# Patient Record
Sex: Male | Born: 1980 | Race: White | Hispanic: No | Marital: Single | Smoking: Current every day smoker
Health system: Southern US, Community
[De-identification: ages and names within clinical notes are randomized; demographics above are authoritative.]

## PROBLEM LIST (undated history)

## (undated) HISTORY — PX: EYE SURGERY: SHX253

---

## 2007-03-06 ENCOUNTER — Emergency Department: Payer: Self-pay | Admitting: Emergency Medicine

## 2008-06-25 ENCOUNTER — Ambulatory Visit: Payer: Self-pay | Admitting: Emergency Medicine

## 2010-05-22 ENCOUNTER — Emergency Department: Payer: Self-pay | Admitting: Unknown Physician Specialty

## 2011-08-28 ENCOUNTER — Ambulatory Visit: Payer: Self-pay | Admitting: Anesthesiology

## 2011-08-28 LAB — BASIC METABOLIC PANEL
Anion Gap: 9 (ref 7–16)
Calcium, Total: 9.2 mg/dL (ref 8.5–10.1)
Chloride: 106 mmol/L (ref 98–107)
Osmolality: 285 (ref 275–301)
Potassium: 4 mmol/L (ref 3.5–5.1)
Sodium: 142 mmol/L (ref 136–145)

## 2011-08-28 LAB — CBC
MCHC: 34.1 g/dL (ref 32.0–36.0)
Platelet: 275 10*3/uL (ref 150–440)
RDW: 12.8 % (ref 11.5–14.5)

## 2011-09-01 ENCOUNTER — Ambulatory Visit: Payer: Self-pay | Admitting: Surgery

## 2011-09-05 LAB — PATHOLOGY REPORT

## 2014-11-08 NOTE — Op Note (Signed)
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   PROCEDURE: Excision of condyloma acuminata of the anal canal and anus.   SURGEON: Renda RollsWilton Smith, Lawrence.D.   ANESTHESIA: General.   INDICATIONS: This 34 year old male recently presented to the office with a chief complaint of hemorrhoids. However, on physical examination found extensive condyloma acuminatum around the anal canal which was circumferential and also found that these extended up into the anal canal. It appeared that these were too extensive to do under local anesthesia in the office. Therefore, brought into the Operating Room for general anesthesia.   DESCRIPTION OF PROCEDURE:   The patient was placed on the operating table in the supine position under general anesthesia. Legs were elevated into the lithotomy position using ankle straps. The scrotum was retracted with tape. The anal area was prepared with Betadine and draped with sterile towels and sheets.   The smoke evacuator was used during the case. The anoderm was infiltrated with 0.5% Sensorcaine with epinephrine. Subsequently, curette was used to remove multiple anal warts and also scissors were used to remove some of them and after a somewhat tedious treatment  externally, the bivalve anal retractor was introduced to examine the anal canal and numerous more isolated smaller warts were identified within the anal canal extending up approximately 4 cm These multiple warts were electrocauterized and also curetted and this was done circumferentially. Next, as the anal retractor was removed, the anoderm was further inspected after cleaning and identified another number of other smaller warts which were electrocauterized and curetted. After this procedure and careful inspection, I did  not identify any other warts. Dressings were applied using Vaseline gauze and 4 x 4 gauze and paper tape.    The patient appeared to be in satisfactory condition and was then prepared for transfer to the recovery room.   ____________________________ Shela CommonsJ. Renda RollsWilton Smith, MD jws:ap D: 09/01/2011 10:28:28 ET T: 09/01/2011 11:37:50 ET JOB#: [id number]  cc: Adella HareJ. Wilton Smith, MD, <Dictator> Adella HareWILTON J SMITH MD ELECTRONICALLY SIGNED 09/02/2011 12:35

## 2015-02-24 ENCOUNTER — Other Ambulatory Visit: Payer: Self-pay | Admitting: Student

## 2015-02-24 DIAGNOSIS — M25561 Pain in right knee: Secondary | ICD-10-CM

## 2015-03-03 ENCOUNTER — Ambulatory Visit
Admission: RE | Admit: 2015-03-03 | Discharge: 2015-03-03 | Disposition: A | Discharge status: Home / Self Care | Payer: BLUE CROSS/BLUE SHIELD | Source: Ambulatory Visit | Attending: Student | Admitting: Student

## 2015-03-03 DIAGNOSIS — M2241 Chondromalacia patellae, right knee: Secondary | ICD-10-CM | POA: Diagnosis not present

## 2015-03-03 DIAGNOSIS — M25561 Pain in right knee: Secondary | ICD-10-CM

## 2020-05-08 ENCOUNTER — Emergency Department: Payer: BC Managed Care – PPO

## 2020-05-08 ENCOUNTER — Other Ambulatory Visit: Payer: Self-pay

## 2020-05-08 DIAGNOSIS — R1012 Left upper quadrant pain: Secondary | ICD-10-CM | POA: Insufficient documentation

## 2020-05-08 DIAGNOSIS — R0789 Other chest pain: Secondary | ICD-10-CM | POA: Diagnosis present

## 2020-05-08 LAB — CBC
HCT: 43.3 % (ref 39.0–52.0)
Hemoglobin: 15.3 g/dL (ref 13.0–17.0)
MCH: 31.9 pg (ref 26.0–34.0)
MCHC: 35.3 g/dL (ref 30.0–36.0)
MCV: 90.4 fL (ref 80.0–100.0)
Platelets: 306 10*3/uL (ref 150–400)
RBC: 4.79 MIL/uL (ref 4.22–5.81)
RDW: 12.3 % (ref 11.5–15.5)
WBC: 11.7 10*3/uL — ABNORMAL HIGH (ref 4.0–10.5)
nRBC: 0 % (ref 0.0–0.2)

## 2020-05-08 NOTE — ED Triage Notes (Signed)
Pt states he has had chest pain all day. Pt denies anything that make pain worse or better. Pt states it has been a constant pain all day.

## 2020-05-09 ENCOUNTER — Encounter: Payer: Self-pay | Admitting: Radiology

## 2020-05-09 ENCOUNTER — Emergency Department
Admission: EM | Admit: 2020-05-09 | Discharge: 2020-05-09 | Disposition: A | Discharge status: Home / Self Care | Payer: BC Managed Care – PPO | Attending: Emergency Medicine | Admitting: Emergency Medicine

## 2020-05-09 ENCOUNTER — Emergency Department: Payer: BC Managed Care – PPO

## 2020-05-09 DIAGNOSIS — R0789 Other chest pain: Secondary | ICD-10-CM

## 2020-05-09 LAB — BASIC METABOLIC PANEL
Anion gap: 8 (ref 5–15)
BUN: 16 mg/dL (ref 6–20)
CO2: 28 mmol/L (ref 22–32)
Calcium: 9.5 mg/dL (ref 8.9–10.3)
Chloride: 105 mmol/L (ref 98–111)
Creatinine, Ser: 1.07 mg/dL (ref 0.61–1.24)
GFR, Estimated: >60 mL/min (ref >60)
Glucose, Bld: 129 mg/dL — ABNORMAL HIGH (ref 70–99)
Potassium: 4 mmol/L (ref 3.5–5.1)
Sodium: 141 mmol/L (ref 135–145)

## 2020-05-09 LAB — HEPATIC FUNCTION PANEL
ALT: 46 U/L — ABNORMAL HIGH (ref 0–44)
AST: 29 U/L (ref 15–41)
Albumin: 4 g/dL (ref 3.5–5.0)
Alkaline Phosphatase: 80 U/L (ref 38–126)
Bilirubin, Direct: <0.1 mg/dL (ref 0.0–0.2)
Total Bilirubin: 0.4 mg/dL (ref 0.3–1.2)
Total Protein: 7.2 g/dL (ref 6.5–8.1)

## 2020-05-09 LAB — LIPASE, BLOOD: Lipase: 36 U/L (ref 11–51)

## 2020-05-09 LAB — TROPONIN I (HIGH SENSITIVITY)
Troponin I (High Sensitivity): 4 ng/L (ref <18)
Troponin I (High Sensitivity): 5 ng/L (ref <18)

## 2020-05-09 MED ORDER — FENTANYL CITRATE (PF) 100 MCG/2ML IJ SOLN
50.0000 ug | Freq: Once | INTRAMUSCULAR | Status: AC
  Administered 2020-05-09: 50 ug via INTRAVENOUS
  Filled 2020-05-09: qty 2

## 2020-05-09 MED ORDER — ONDANSETRON HCL 4 MG/2ML IJ SOLN
4.0000 mg | Freq: Once | INTRAMUSCULAR | Status: AC
  Administered 2020-05-09: 4 mg via INTRAVENOUS
  Filled 2020-05-09: qty 2

## 2020-05-09 MED ORDER — IOHEXOL 300 MG/ML  SOLN
100.0000 mL | Freq: Once | INTRAMUSCULAR | Status: AC | PRN
  Administered 2020-05-09: 100 mL via INTRAVENOUS

## 2020-05-09 NOTE — Discharge Instructions (Signed)

## 2020-05-09 NOTE — ED Provider Notes (Signed)
[facility] Emergency Department Provider Note  ____________________________________________  Time seen: Approximately 5:12 AM  I have reviewed the triage vital signs and the nursing notes.   HISTORY  Chief Complaint Chest Pain   HPI Wayne Lawrence is a 39 y.o. male no significant past medical history who presents for evaluation of chest pain. Patient reports that his symptoms started yesterday morning. He describes the pain as sharp and dull located in the left lower chest/left upper abdomen.  He has had the pain on and off for the last 24 hours. He reports that the pain usually comes on and last for several hours and may go away for 5 minutes but then he returns. No shortness of breath, the pain is nonpleuritic, no nausea, no vomiting, no fever, no dysuria or hematuria. He does report having a history of chronic diarrhea although he usually goes twice a day and over the last 24 hours he went four times. He denies any melena or hematochezia. He denies any history of alcohol abuse or recent binge episode. He denies any known history of peptic ulcer disease. He denies excessive use of NSAID although does report taking three ibuprofen today with no significant improvement of the pain. Denies any prior abdominal surgeries. Patient is a smoker and has history of heart disease in his grandfather. No personal or family history of blood clots, recent travel immobilization, leg pain or swelling, hemoptysis or exogenous hormones.  PMH Chronic diarrhea  Allergies Bee pollen and Tramadol  FH No family history of PE or DVT Grandfather -CAD  Social History Smoking - yes Alcohol - socially Drugs - no  Review of Systems  Constitutional: Negative for fever. Eyes: Negative for visual changes. ENT: Negative for sore throat. Neck: No neck pain  Cardiovascular: + chest pain. Respiratory: Negative for shortness of breath. Gastrointestinal: + upper abdominal pain and  diarrhea. No vomiting  Genitourinary: Negative for dysuria. Musculoskeletal: Negative for back pain. Skin: Negative for rash. Neurological: Negative for headaches, weakness or numbness. Psych: No SI or HI  ____________________________________________   PHYSICAL EXAM:  VITAL SIGNS: Vitals:   05/08/20 2320 05/09/20 0245  BP: 130/82 133/86  Pulse: 95 85  Resp:  18  Temp: 99.1 F (37.3 C) 98.6 F (37 C)  SpO2: 99% 100%   Constitutional: Alert and oriented. Well appearing and in no apparent distress. HEENT:      Head: Normocephalic and atraumatic.         Eyes: Conjunctivae are normal. Sclera is non-icteric.       Mouth/Throat: Mucous membranes are moist.       Neck: Supple with no signs of meningismus. Cardiovascular: Regular rate and rhythm. No murmurs, gallops, or rubs. 2+ symmetrical distal pulses are present in all extremities. No JVD. Respiratory: Normal respiratory effort. Lungs are clear to auscultation bilaterally. No wheezes, crackles, or rhonchi.  Gastrointestinal: Soft, palpation of the left upper quadrant reproduces the pain, non distended with positive bowel sounds. No rebound or guarding. Musculoskeletal:  No edema, cyanosis, or erythema of extremities. Neurologic: Normal speech and language. Face is symmetric. Moving all extremities. No gross focal neurologic deficits are appreciated. Skin: Skin is warm, dry and intact. No rash noted. Psychiatric: Mood and affect are normal. Speech and behavior are normal.  ____________________________________________   LABS (all labs ordered are listed, but only abnormal results are displayed)  Labs Reviewed  BASIC METABOLIC PANEL - Abnormal; Notable for the following components:      Result Value  Glucose, Bld 129 (*)    All other components within normal limits  CBC - Abnormal; Notable for the following components:   WBC 11.7 (*)    All other components within normal limits  HEPATIC FUNCTION PANEL - Abnormal; Notable  for the following components:   ALT 46 (*)    All other components within normal limits  LIPASE, BLOOD  TROPONIN I (HIGH SENSITIVITY)  TROPONIN I (HIGH SENSITIVITY)   ____________________________________________  EKG  ED ECG REPORT I, Nita Sickle, the attending physician, personally viewed and interpreted this ECG.  Normal sinus rhythm, rate of ninety-five, normal intervals, normal axis, no ST elevations or depressions. Normal EKG ____________________________________________  RADIOLOGY  I have personally reviewed the images performed during this visit and I agree with the Radiologist's read.   Interpretation by Radiologist:  DG Chest 2 View  Result Date: 05/09/2020 CLINICAL DATA:  Chest pain all day. EXAM: CHEST - 2 VIEW COMPARISON:  Left ribs 03/06/2007 FINDINGS: The heart size and mediastinal contours are within normal limits. Both lungs are clear. The visualized skeletal structures are unremarkable. IMPRESSION: No active cardiopulmonary disease. Electronically Signed   By: Burman Nieves M.D.   On: 05/09/2020 00:00   CT ABDOMEN PELVIS W CONTRAST  Result Date: 05/09/2020 CLINICAL DATA:  Epigastric pain EXAM: CT ABDOMEN AND PELVIS WITH CONTRAST TECHNIQUE: Multidetector CT imaging of the abdomen and pelvis was performed using the standard protocol following bolus administration of intravenous contrast. CONTRAST:  OMNIPAQUE IOHEXOL 300 MG/ML  SOLN COMPARISON:  None. FINDINGS: LOWER CHEST: Normal. HEPATOBILIARY: Normal hepatic contours. No intra- or extrahepatic biliary dilatation. The gallbladder is normal. PANCREAS: Normal pancreas. No ductal dilatation or peripancreatic fluid collection. SPLEEN: Normal. ADRENALS/URINARY TRACT: The adrenal glands are normal. No hydronephrosis, nephroureterolithiasis or solid renal mass. The urinary bladder is normal for degree of distention STOMACH/BOWEL: There is no hiatal hernia. Normal duodenal course and caliber. No small bowel  dilatation or inflammation. No focal colonic abnormality. Normal appendix. VASCULAR/LYMPHATIC: Normal course and caliber of the major abdominal vessels. No abdominal or pelvic lymphadenopathy. REPRODUCTIVE: Normal prostate size with symmetric seminal vesicles. MUSCULOSKELETAL. No bony spinal canal stenosis or focal osseous abnormality. OTHER: None. IMPRESSION: No acute abnormality of the abdomen or pelvis. Electronically Signed   By: Deatra Robinson M.D.   On: 05/09/2020 05:52     ____________________________________________   PROCEDURES  Procedure(s) performed:yes .1-3 Lead EKG Interpretation Performed by: Nita Sickle, MD Authorized by: Nita Sickle, MD     Interpretation: normal     ECG rate assessment: normal     Rhythm: sinus rhythm     Ectopy: none     Critical Care performed:  None ____________________________________________   INITIAL IMPRESSION / ASSESSMENT AND PLAN / ED COURSE   39 y.o. male no significant past medical history who presents for evaluation of Left lower chest pain/ LUQ abd pain x 24 hours and worsening chronic diarrhea. Patient is extremely well-appearing in no distress with normal vital signs, abdomen is soft with mild tenderness palpation of the left upper quadrant which reproduces the pain the patient has been experiencing, lungs are clear to auscultation, pulses are strong and equal in all four extremities. EKG is normal. Chest x-ray visualized by me with no acute findings, confirmed by radiology. Presentation atypical for ACS however patient did have two high-sensitivity troponins which were negative. PERC negative. Mild leukocytosis with white count of 11.7. LFTs and lipase added on. CT abdomen pelvis pending. Differential diagnoses including peptic ulcer disease versus gastritis  versus duodenitis versus GERD versus diverticulitis versus kidney stones versus pancreatitis versus colitis versus gastroenteritis. We will treat the pain with IV Zofran and  fentanyl. Old medical records reviewed with no prior abdominal imaging. Patient placed on telemetry for close monitoring.  _________________________ 6:08 AM on 05/09/2020 -----------------------------------------  CT visualized by me with no acute findings in the lower lungs and abdomen pelvis, confirmed by radiology.  Will discharge patient home with follow-up with his PCP for possible GI referral.  Discussed my standard return precautions for new or worsening abdominal pain, chest pain, shortness of breath or fever.    _____________________________________________ Please note:  Patient was evaluated in Emergency Department today for the symptoms described in the history of present illness. Patient was evaluated in the context of the global COVID-19 pandemic, which necessitated consideration that the patient might be at risk for infection with the SARS-CoV-2 virus that causes COVID-19. Institutional protocols and algorithms that pertain to the evaluation of patients at risk for COVID-19 are in a state of rapid change based on information released by regulatory bodies including the CDC and federal and state organizations. These policies and algorithms were followed during the patient's care in the ED.  Some ED evaluations and interventions may be delayed as a result of limited staffing during the pandemic.   [place] Controlled Substance Database was reviewed by me. ____________________________________________   FINAL CLINICAL IMPRESSION(S) / ED DIAGNOSES   Final diagnoses:  Atypical chest pain      NEW MEDICATIONS STARTED DURING THIS VISIT:  ED Discharge Orders    None       Note:  This document was prepared using Dragon voice recognition software and may include unintentional dictation errors.    Nita Sickle, MD 05/09/20 [phone]

## 2021-05-20 IMAGING — CR DG CHEST 2V
1 series · 2 of 2 positions shown · non-contrast
Comparison: Left ribs 03/06/2007

CLINICAL DATA: Chest pain all day.

EXAM:
CHEST - 2 VIEW

[Series 1: dg chest 2 view · 0.14mm/px · 2 of 2 slices shown]
[im 1/2]
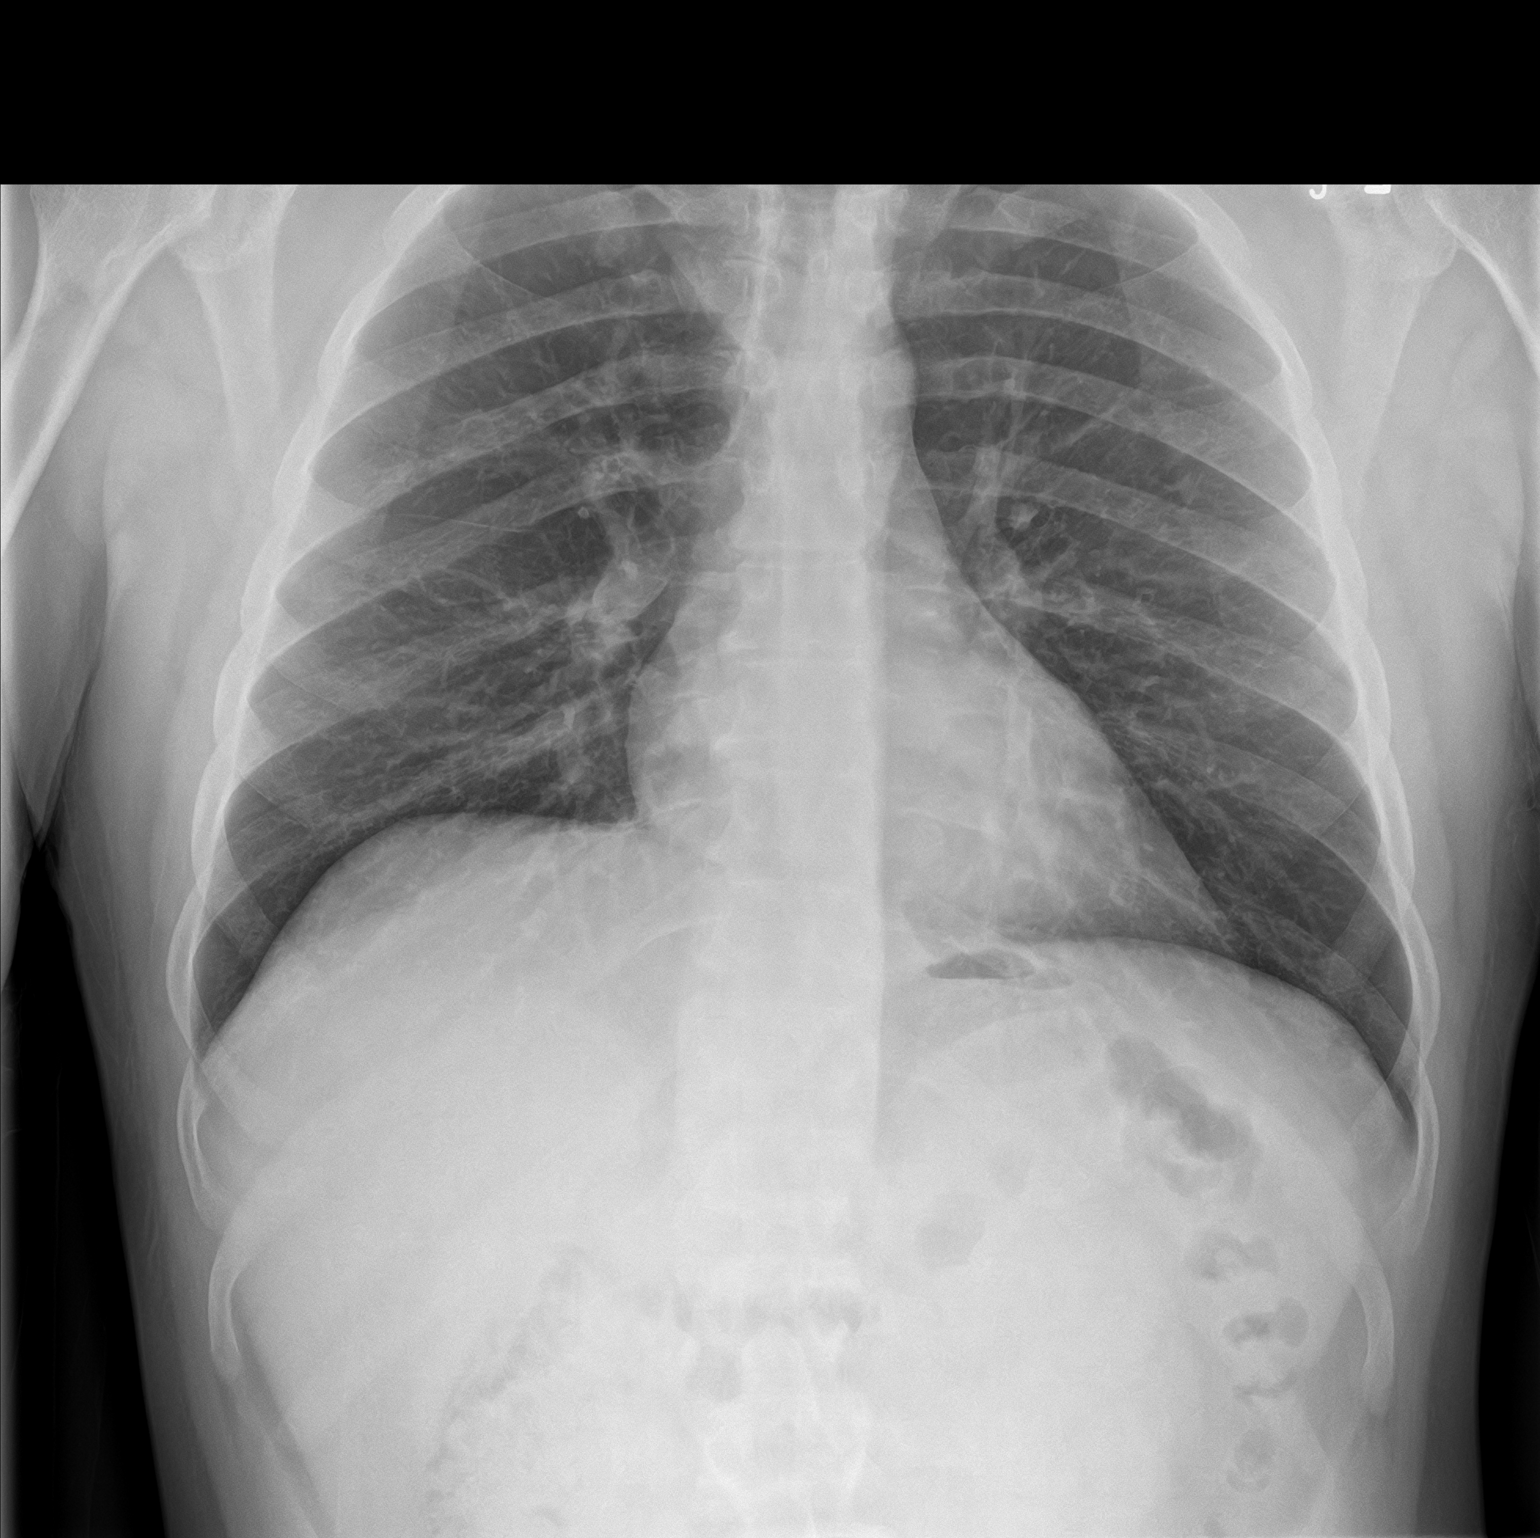
[im 2/2]
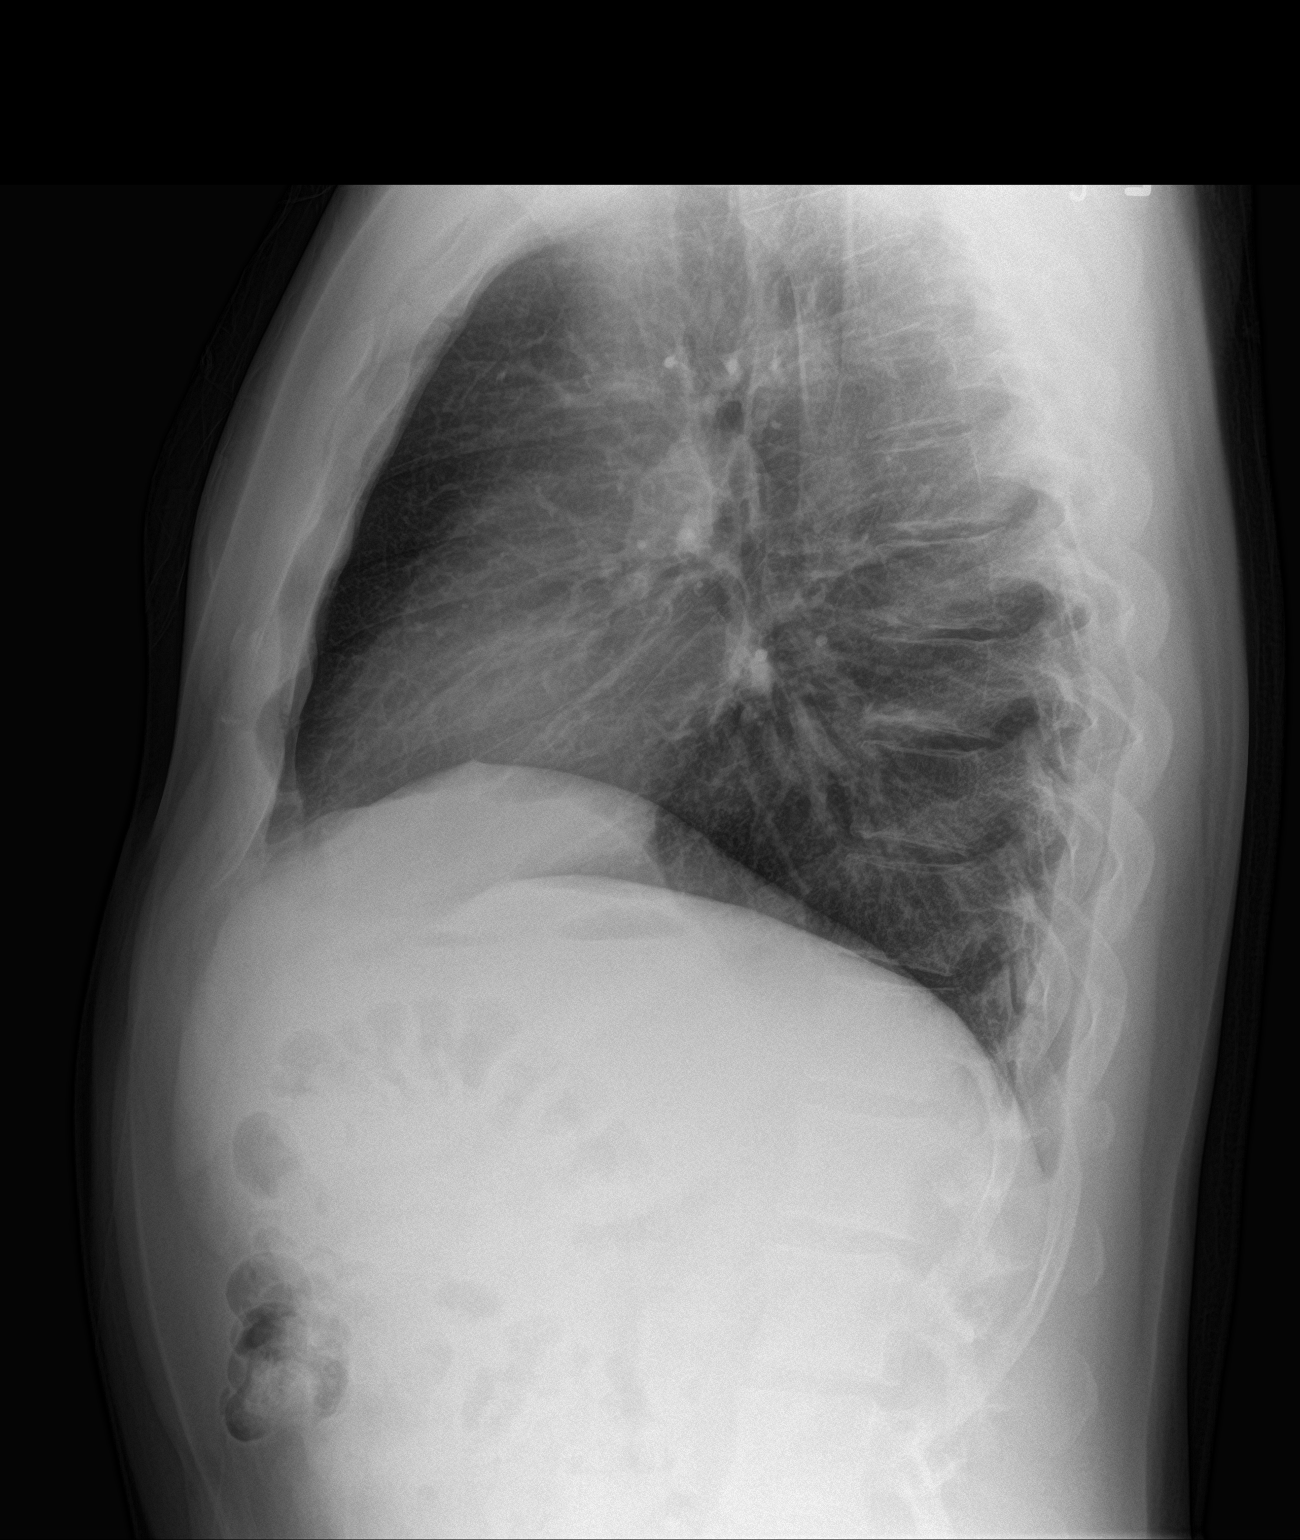

[2 of 2 positions shown; findings below may reference images not displayed]

FINDINGS: The heart size and mediastinal contours are within normal limits.
Both lungs are clear. The visualized skeletal structures are
unremarkable.
IMPRESSION: No active cardiopulmonary disease.

## 2022-09-28 ENCOUNTER — Ambulatory Visit
Admission: RE | Admit: 2022-09-28 | Discharge: 2022-09-28 | Disposition: A | Discharge status: Home / Self Care | Payer: BC Managed Care – PPO | Source: Ambulatory Visit | Attending: Emergency Medicine | Admitting: Emergency Medicine

## 2022-09-28 VITALS — BP 138/95 | HR 114 | Temp 98.9°F | Resp 18 | Ht 66.0 in | Wt 165.0 lb

## 2022-09-28 DIAGNOSIS — J020 Streptococcal pharyngitis: Secondary | ICD-10-CM | POA: Diagnosis not present

## 2022-09-28 LAB — POCT RAPID STREP A (OFFICE): Rapid Strep A Screen: POSITIVE — AB

## 2022-09-28 MED ORDER — PENICILLIN G BENZATHINE 1200000 UNIT/2ML IM SUSY
1.2000 10*6.[IU] | PREFILLED_SYRINGE | Freq: Once | INTRAMUSCULAR | Status: AC
  Administered 2022-09-28: 1.2 10*6.[IU] via INTRAMUSCULAR

## 2022-09-28 NOTE — ED Triage Notes (Signed)
Patient to Urgent Care with complaints of  sore throat, fevers max 100, generalized body aches. Sinus pain and pressure.  Symptoms started Monday. Has been taking aleve-D, flonase, and zyrtec.

## 2022-09-28 NOTE — ED Provider Notes (Signed)
[facility]    CSN: [id number] Arrival date & time: 09/28/22  1147      History   Chief Complaint Chief Complaint  Patient presents with   Sore Throat    Fever, sore body - Entered by patient    HPI Wayne Lawrence is a 42 y.o. male.  Patient presents with 3 day history of low grade fever, body aches, sinus pressure, sore throat, mild cough.  The sore throat is his worst symptom.  Treating symptoms with Flonase, Zyrtec, Aleve.  He denies rash, shortness of breath, vomiting, diarrhea, or other symptoms.  No pertinent medical history.    The history is provided by the patient and medical records.    History reviewed. No pertinent past medical history.  There are no problems to display for this patient.   Past Surgical History:  Procedure Laterality Date   EYE SURGERY         Home Medications    Prior to Admission medications   Not on File    Family History History reviewed. No pertinent family history.  Social History Social History   Tobacco Use   Smoking status: Every Day    Types: Cigarettes  Substance Use Topics   Alcohol use: Never   Drug use: Never     Allergies   Tramadol   Review of Systems Review of Systems  Constitutional:  Positive for fever. Negative for chills.  HENT:  Positive for sinus pressure and sore throat. Negative for ear pain.   Respiratory:  Positive for cough. Negative for shortness of breath.   Cardiovascular:  Negative for chest pain and palpitations.  Gastrointestinal:  Negative for diarrhea and vomiting.  Skin:  Negative for color change and rash.  All other systems reviewed and are negative.    Physical Exam Triage Vital Signs ED Triage Vitals  Enc Vitals Group     BP 09/28/22 1210 (!) 138/95     Pulse Rate 09/28/22 1156 (!) 114     Resp 09/28/22 1156 18     Temp 09/28/22 1156 98.9 F (37.2 C)     Temp src --      SpO2 09/28/22 1156 98 %     Weight 09/28/22 1207 165 lb (74.8 kg)     Height  09/28/22 1207 '5\' 6"'$  (1.676 m)     Head Circumference --      Peak Flow --      Pain Score 09/28/22 1205 5     Pain Loc --      Pain Edu? --      Excl. in [place]? --    No data found.  Updated Vital Signs BP (!) 138/95   Pulse (!) 114   Temp 98.9 F (37.2 C)   Resp 18   Ht '5\' 6"'$  (XX123456 m)   Wt 165 lb (74.8 kg)   SpO2 98%   BMI 26.63 kg/m   Visual Acuity Right Eye Distance:   Left Eye Distance:   Bilateral Distance:    Right Eye Near:   Left Eye Near:    Bilateral Near:     Physical Exam Vitals and nursing note reviewed.  Constitutional:      General: He is not in acute distress.    Appearance: Normal appearance. He is well-developed. He is not ill-appearing.  HENT:     Head: Normocephalic and atraumatic.     Right Ear: Tympanic membrane normal.     Left Ear: Tympanic membrane normal.  Nose: Nose normal.     Mouth/Throat:     Mouth: Mucous membranes are moist.     Pharynx: Posterior oropharyngeal erythema present.  Cardiovascular:     Rate and Rhythm: Normal rate and regular rhythm.     Heart sounds: Normal heart sounds.  Pulmonary:     Effort: Pulmonary effort is normal. No respiratory distress.     Breath sounds: Normal breath sounds.  Musculoskeletal:     Cervical back: Neck supple.  Skin:    General: Skin is warm and dry.  Neurological:     Mental Status: He is alert.  Psychiatric:        Mood and Affect: Mood normal.        Behavior: Behavior normal.      UC Treatments / Results  Labs (all labs ordered are listed, but only abnormal results are displayed) Labs Reviewed  POCT RAPID STREP A (OFFICE) - Abnormal; Notable for the following components:      Result Value   Rapid Strep A Screen Positive (*)    All other components within normal limits    EKG   Radiology No results found.  Procedures Procedures (including critical care time)  Medications Ordered in UC Medications  penicillin g benzathine (BICILLIN LA) 1200000 UNIT/2ML  injection 1.2 Million Units (1.2 Million Units Intramuscular Given 09/28/22 1222)    Initial Impression / Assessment and Plan / UC Course  I have reviewed the triage vital signs and the nursing notes.  Pertinent labs & imaging results that were available during my care of the patient were reviewed by me and considered in my medical decision making (see chart for details).    Strep pharyngitis.  Rapid strep positive.  Per patient preference, treating with Bicillin LA.  Discussed symptomatic treatment including Tylenol or ibuprofen.  Instructed patient to follow up with PCP if symptoms are not improving.  Education provided on strep throat.  He agrees to plan of care.   Final Clinical Impressions(s) / UC Diagnoses   Final diagnoses:  Strep pharyngitis     Discharge Instructions      You were given an injection of a long acting penicillin to treat strep throat.    Take Tylenol or ibuprofen  as needed for fever or discomfort.    Follow-up with your primary care provider if your symptoms are not improving.         ED Prescriptions   None    PDMP not reviewed this encounter.   Sharion Balloon, NP 09/28/22 1227

## 2022-09-28 NOTE — Discharge Instructions (Addendum)
You were given an injection of a long acting penicillin to treat strep throat.    Take Tylenol or ibuprofen  as needed for fever or discomfort.    Follow-up with your primary care provider if your symptoms are not improving.
